# Patient Record
Sex: Female | Born: 1980 | Race: Black or African American | Hispanic: No | Marital: Single | Smoking: Former smoker
Health system: Southern US, Community
[De-identification: ages and names within clinical notes are randomized; demographics above are authoritative.]

---

## 2000-11-02 ENCOUNTER — Other Ambulatory Visit: Admission: RE | Admit: 2000-11-02 | Discharge: 2000-11-02 | Payer: Self-pay | Admitting: Obstetrics

## 2000-11-02 ENCOUNTER — Encounter (INDEPENDENT_AMBULATORY_CARE_PROVIDER_SITE_OTHER): Payer: Self-pay

## 2001-10-12 ENCOUNTER — Encounter: Admission: RE | Admit: 2001-10-12 | Discharge: 2001-10-12 | Payer: Self-pay | Admitting: Family Medicine

## 2001-10-12 ENCOUNTER — Other Ambulatory Visit: Admission: RE | Admit: 2001-10-12 | Discharge: 2001-10-12 | Payer: Self-pay | Admitting: Family Medicine

## 2001-10-12 ENCOUNTER — Encounter (INDEPENDENT_AMBULATORY_CARE_PROVIDER_SITE_OTHER): Payer: Self-pay | Admitting: Specialist

## 2001-10-26 ENCOUNTER — Encounter: Admission: RE | Admit: 2001-10-26 | Discharge: 2001-10-26 | Payer: Self-pay | Admitting: Family Medicine

## 2002-11-29 ENCOUNTER — Encounter: Admission: RE | Admit: 2002-11-29 | Discharge: 2002-11-29 | Payer: Self-pay | Admitting: Family Medicine

## 2002-11-29 ENCOUNTER — Other Ambulatory Visit: Admission: RE | Admit: 2002-11-29 | Discharge: 2002-11-29 | Payer: Self-pay | Admitting: Family Medicine

## 2002-11-29 ENCOUNTER — Encounter (INDEPENDENT_AMBULATORY_CARE_PROVIDER_SITE_OTHER): Payer: Self-pay | Admitting: *Deleted

## 2002-12-06 ENCOUNTER — Encounter: Admission: RE | Admit: 2002-12-06 | Discharge: 2002-12-06 | Payer: Self-pay | Admitting: Sports Medicine

## 2003-03-07 ENCOUNTER — Encounter: Admission: RE | Admit: 2003-03-07 | Discharge: 2003-03-07 | Payer: Self-pay | Admitting: Sports Medicine

## 2003-04-04 ENCOUNTER — Encounter (INDEPENDENT_AMBULATORY_CARE_PROVIDER_SITE_OTHER): Payer: Self-pay | Admitting: *Deleted

## 2003-04-04 ENCOUNTER — Encounter: Admission: RE | Admit: 2003-04-04 | Discharge: 2003-04-04 | Payer: Self-pay | Admitting: Family Medicine

## 2003-04-04 ENCOUNTER — Other Ambulatory Visit: Admission: RE | Admit: 2003-04-04 | Discharge: 2003-04-04 | Payer: Self-pay | Admitting: Family Medicine

## 2003-05-02 ENCOUNTER — Encounter: Admission: RE | Admit: 2003-05-02 | Discharge: 2003-05-02 | Payer: Self-pay | Admitting: Family Medicine

## 2004-04-30 ENCOUNTER — Other Ambulatory Visit: Admission: RE | Admit: 2004-04-30 | Discharge: 2004-04-30 | Payer: Self-pay | Admitting: Family Medicine

## 2004-04-30 ENCOUNTER — Encounter (INDEPENDENT_AMBULATORY_CARE_PROVIDER_SITE_OTHER): Payer: Self-pay | Admitting: Sports Medicine

## 2004-04-30 ENCOUNTER — Encounter: Admission: RE | Admit: 2004-04-30 | Discharge: 2004-04-30 | Payer: Self-pay | Admitting: Family Medicine

## 2004-05-07 ENCOUNTER — Encounter: Admission: RE | Admit: 2004-05-07 | Discharge: 2004-05-07 | Payer: Self-pay | Admitting: Family Medicine

## 2004-08-05 ENCOUNTER — Ambulatory Visit: Payer: Self-pay | Admitting: Sports Medicine

## 2005-01-16 ENCOUNTER — Ambulatory Visit: Payer: Self-pay | Admitting: Sports Medicine

## 2005-06-30 ENCOUNTER — Ambulatory Visit: Payer: Self-pay | Admitting: Sports Medicine

## 2005-07-22 ENCOUNTER — Ambulatory Visit: Payer: Self-pay | Admitting: Family Medicine

## 2006-03-15 ENCOUNTER — Encounter (INDEPENDENT_AMBULATORY_CARE_PROVIDER_SITE_OTHER): Payer: Self-pay | Admitting: *Deleted

## 2006-03-15 LAB — CONVERTED CEMR LAB

## 2006-04-02 ENCOUNTER — Encounter (INDEPENDENT_AMBULATORY_CARE_PROVIDER_SITE_OTHER): Payer: Self-pay | Admitting: Sports Medicine

## 2006-04-02 ENCOUNTER — Ambulatory Visit: Payer: Self-pay | Admitting: Family Medicine

## 2006-04-12 ENCOUNTER — Emergency Department (HOSPITAL_COMMUNITY): Admission: EM | Admit: 2006-04-12 | Discharge: 2006-04-12 | Payer: Self-pay | Admitting: Emergency Medicine

## 2006-11-13 ENCOUNTER — Encounter (INDEPENDENT_AMBULATORY_CARE_PROVIDER_SITE_OTHER): Payer: Self-pay | Admitting: *Deleted

## 2007-02-10 IMAGING — CT CT MAXILLOFACIAL W/O CM
1 of 3 series · 15 of 30 positions shown, 19 images · IV contrast (agent unspecified)
Comparison: none

CLINICAL DATA: Head injury.   The patient fell on his head and face. 
 HEAD CT WITHOUT CONTRAST:
TECHNIQUE: Contiguous axial images were obtained from the base of the skull through the vertex according to standard protocol without contrast.
TECHNIQUE: Axial and coronal CT imaging was performed through the maxillofacial structures.  No intravenous contrast was administered.

[Series 6: orbit 1.0 h30s · axial · 0.30mm/px · z∈[-276,-129]mm · 15 of 165 slices shown, 19 images]
[im 9/165  brain]
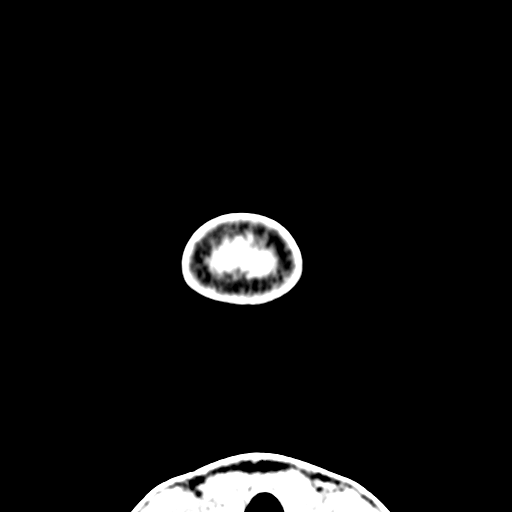
[im 9/165  bone]
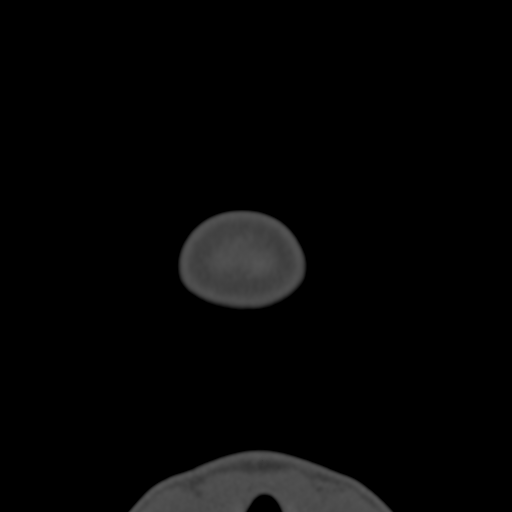
[im 18/165  bone]
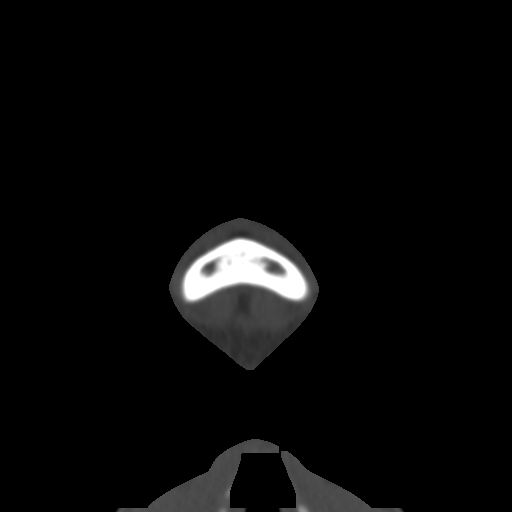
[im 35/165  bone]
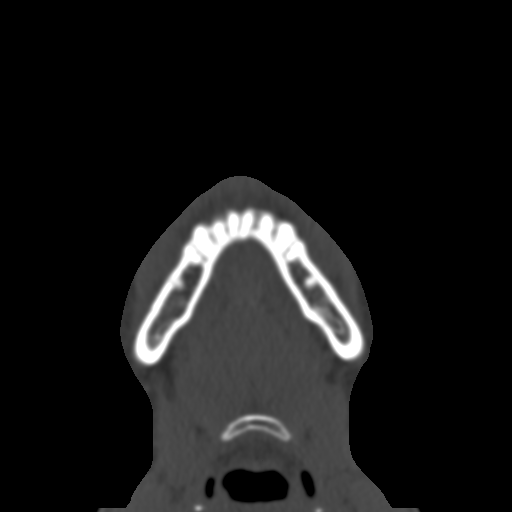
[im 44/165  bone]
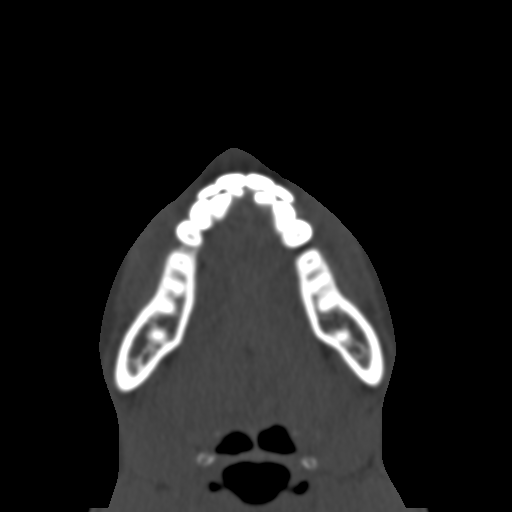
[im 52/165  brain]
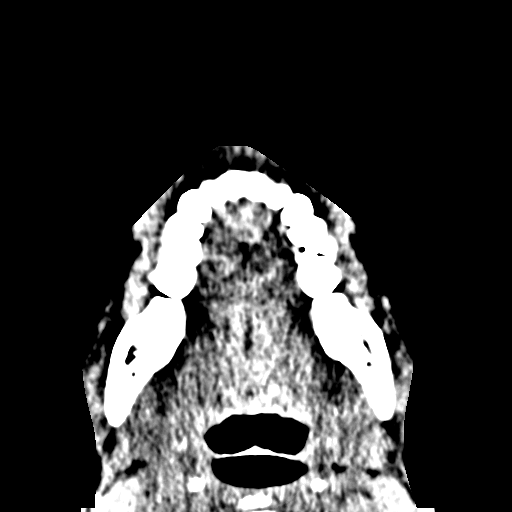
[im 52/165  bone]
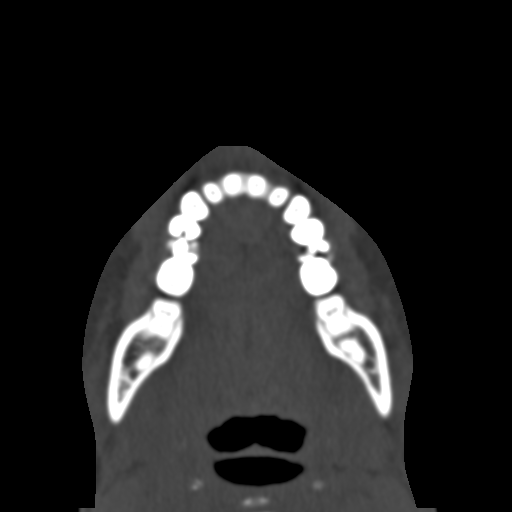
[im 61/165  bone]
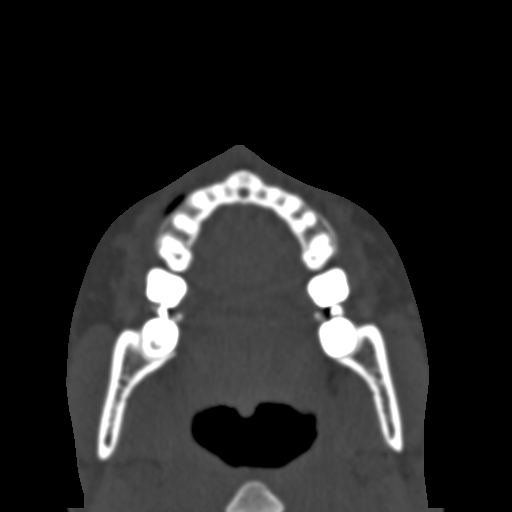
[im 70/165  bone]
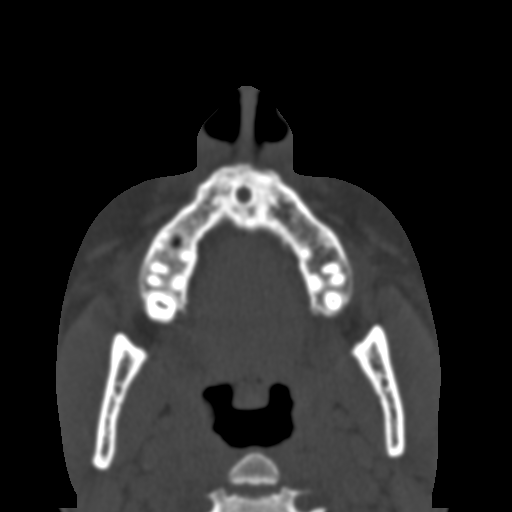
[im 87/165  bone]
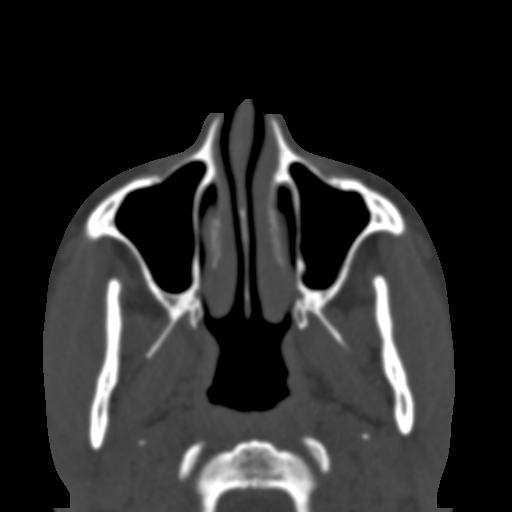
[im 95/165  brain]
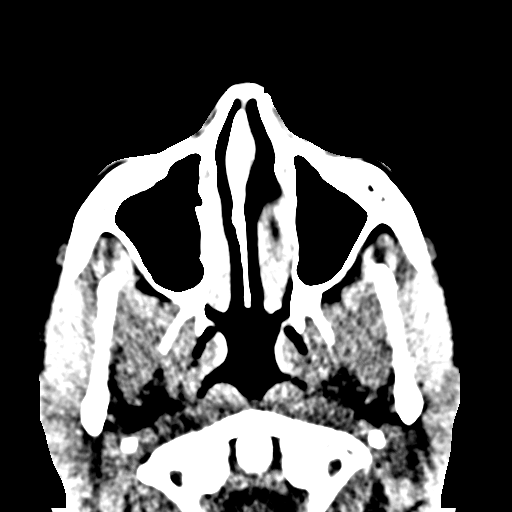
[im 95/165  bone]
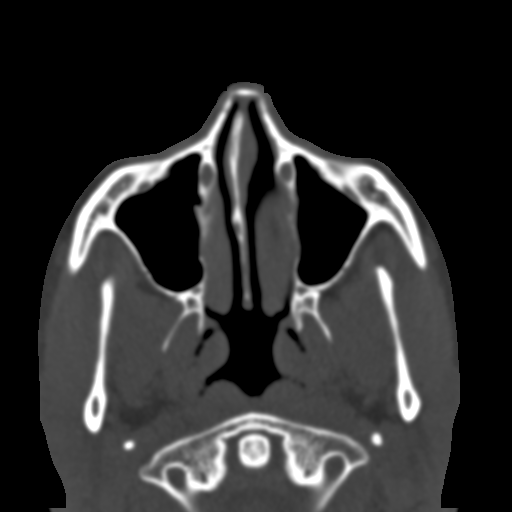
[im 104/165  bone]
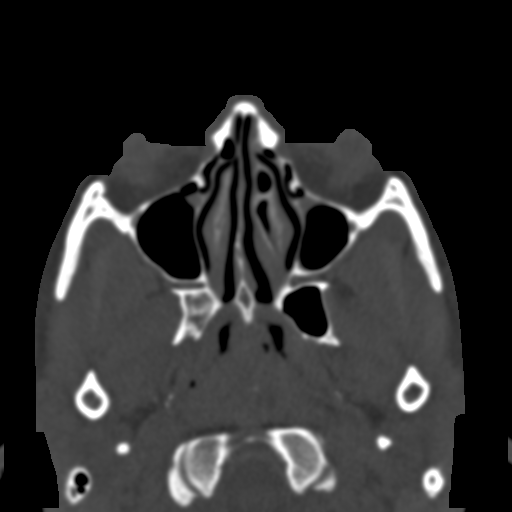
[im 113/165  bone]
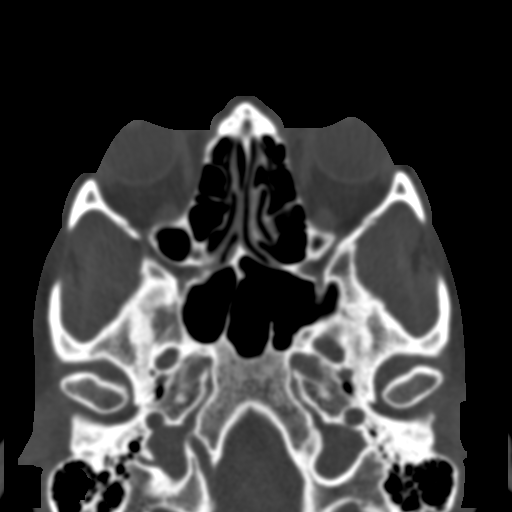
[im 121/165  bone]
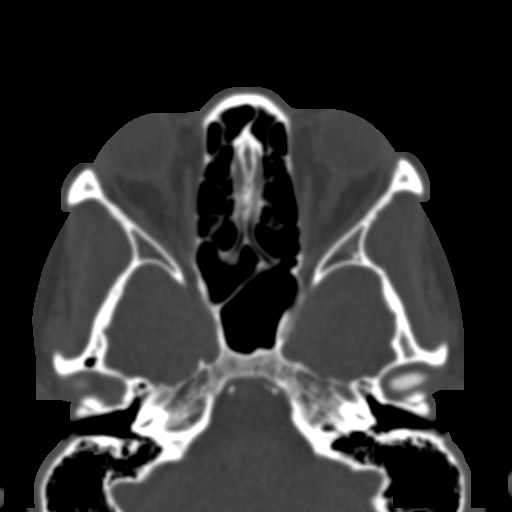
[im 139/165  brain]
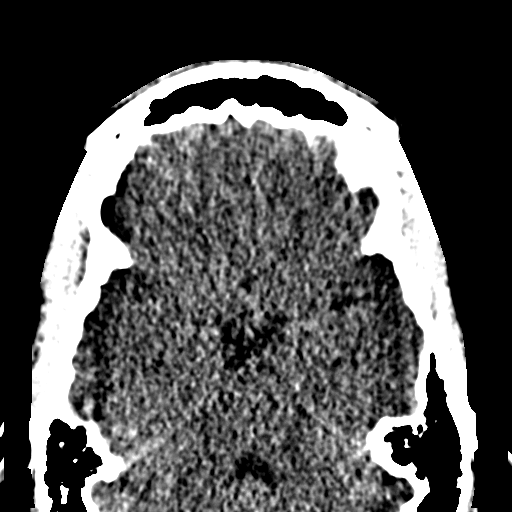
[im 139/165  bone]
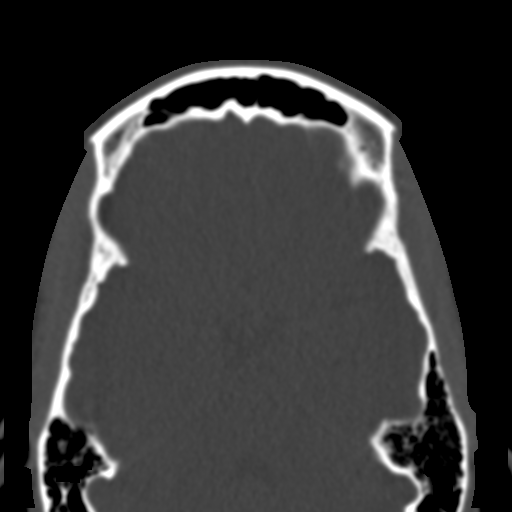
[im 147/165  bone]
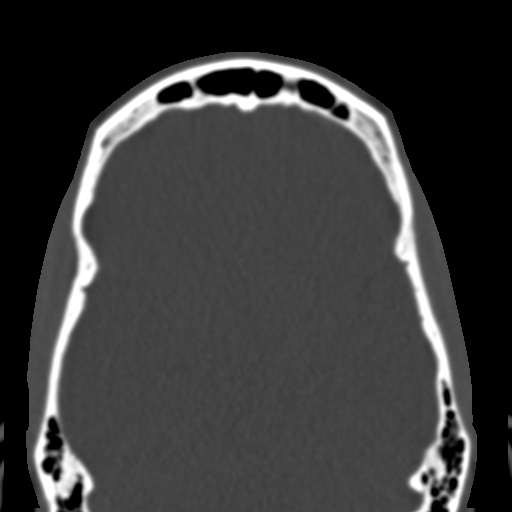
[im 156/165  bone]
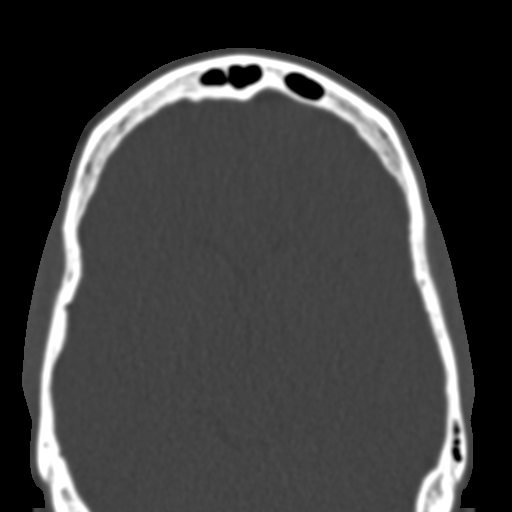

[15 of 30 positions shown; findings below may reference images not displayed]

FINDINGS: There is a tiny area of density along the left side of the tentorium on image #14, which I feel probably represents partial volume effect of the tentorium, but it could represent a tiny hemorrhage at that site. 
 The remainder of the brain is normal.  The ventricles are normal in size.  No bony abnormality.
IMPRESSION: 1.  Essentially normal scan.  Tiny area of density adjacent to the left side of the tentorium is probably partial volume effect, but I cannot exclude a tiny hemorrhage on the tentorium at that site. 
 2.  Otherwise normal exam. 
 MAXILLOFACIAL CT WITHOUT CONTRAST:
FINDINGS: There is no fracture, sinus opacification, or other significant abnormality.
IMPRESSION: Normal facial bones.

## 2007-04-29 ENCOUNTER — Encounter: Payer: Self-pay | Admitting: Family Medicine

## 2007-07-07 ENCOUNTER — Ambulatory Visit: Payer: Self-pay | Admitting: Family Medicine

## 2007-07-07 ENCOUNTER — Encounter: Payer: Self-pay | Admitting: Family Medicine

## 2007-07-07 DIAGNOSIS — Z87898 Personal history of other specified conditions: Secondary | ICD-10-CM

## 2007-07-07 DIAGNOSIS — A6000 Herpesviral infection of urogenital system, unspecified: Secondary | ICD-10-CM | POA: Insufficient documentation

## 2007-07-07 LAB — CONVERTED CEMR LAB
BUN: 13 mg/dL (ref 6–23)
CO2: 23 meq/L (ref 19–32)
Calcium: 9.1 mg/dL (ref 8.4–10.5)
Chloride: 104 meq/L (ref 96–112)

## 2007-07-08 ENCOUNTER — Encounter: Payer: Self-pay | Admitting: Family Medicine

## 2007-07-14 ENCOUNTER — Encounter: Payer: Self-pay | Admitting: Family Medicine

## 2008-10-25 ENCOUNTER — Ambulatory Visit: Payer: Self-pay | Admitting: Family Medicine

## 2008-10-25 ENCOUNTER — Encounter: Payer: Self-pay | Admitting: Family Medicine

## 2008-10-25 LAB — CONVERTED CEMR LAB
Chlamydia, DNA Probe: NEGATIVE
Total CHOL/HDL Ratio: 2.6
Whiff Test: NEGATIVE

## 2008-10-26 ENCOUNTER — Encounter: Payer: Self-pay | Admitting: Family Medicine

## 2008-10-27 ENCOUNTER — Encounter: Payer: Self-pay | Admitting: Family Medicine

## 2010-10-24 ENCOUNTER — Encounter: Payer: Self-pay | Admitting: *Deleted

## 2011-07-30 ENCOUNTER — Other Ambulatory Visit (HOSPITAL_COMMUNITY)
Admission: RE | Admit: 2011-07-30 | Discharge: 2011-07-30 | Disposition: A | Discharge status: Home / Self Care | Payer: Managed Care, Other (non HMO) | Source: Ambulatory Visit | Attending: Family Medicine | Admitting: Family Medicine

## 2011-07-30 ENCOUNTER — Encounter: Payer: Self-pay | Admitting: Family Medicine

## 2011-07-30 ENCOUNTER — Ambulatory Visit (INDEPENDENT_AMBULATORY_CARE_PROVIDER_SITE_OTHER): Payer: Managed Care, Other (non HMO) | Admitting: Family Medicine

## 2011-07-30 VITALS — BP 114/73 | HR 79 | Temp 98.5°F | Ht 65.6 in | Wt 138.0 lb

## 2011-07-30 DIAGNOSIS — Z23 Encounter for immunization: Secondary | ICD-10-CM

## 2011-07-30 DIAGNOSIS — Z124 Encounter for screening for malignant neoplasm of cervix: Secondary | ICD-10-CM

## 2011-07-30 DIAGNOSIS — Z0000 Encounter for general adult medical examination without abnormal findings: Secondary | ICD-10-CM

## 2011-07-30 DIAGNOSIS — N760 Acute vaginitis: Secondary | ICD-10-CM

## 2011-07-30 DIAGNOSIS — Z01419 Encounter for gynecological examination (general) (routine) without abnormal findings: Secondary | ICD-10-CM | POA: Insufficient documentation

## 2011-07-30 LAB — RPR

## 2011-07-30 LAB — POCT WET PREP (WET MOUNT)

## 2011-07-30 MED ORDER — VALACYCLOVIR HCL 500 MG PO TABS: ORAL_TABLET | ORAL | Status: DC

## 2011-07-30 MED ORDER — METRONIDAZOLE 500 MG PO TABS: ORAL_TABLET | ORAL | Status: DC

## 2011-07-30 NOTE — Progress Notes (Signed)
  Subjective:    Patient ID: Kristie Day, female    DOB: 09-03-1981, 30 y.o.   MRN: [id number]  HPI  Here for checkup. Not having any issues. Menses are regular. She is not currently sexually active. Has been on suppression for her herpes and has only had one outbreak in the last year. Has some questions about herpes.  Continues to have issues with vaginal discharge. The metronidazole I had given her in the past seems to help when she uses it intermittently but she's not had any for a while.  Has a new job working for [employer]. He is walking for exercise. Not having any significant health issues at this time.  Review of Systems    review of systems is negative except for vaginal discharge and herpes outbreak as above. In history of present illness. Objective:   Physical Exam  Vital signs reviewed GENERALl: Well developed, well nourished, in no acute distress. NECK: Supple, FROM, without lymphadenopathy.  THYROID: normal without nodularity CAROTID ARTERIES: without bruits LUNGS: clear to auscultation bilaterally. No wheezes or rales. HEART: Regular rate and rhythm, no murmurs ABDOMEN: soft with positive bowel sounds NEURO: No gross focal deficits GU: Externally normal. Cervix appears normal. There is thin white discharge in the vaginal vault. The cervix is retroflexed. No adnexal masses or tenderness. SKIN: Complete skin exam reveals no worrisome lesions. BREASTS: Bilaterally symmetrical without worrisome mass, no skin changes, no nipple changes. PSYCH: Affect is a little flat but she asks and answers questions appropriately. Alert and oriented x4.       Assessment & Plan:  Well adult exam. Pap smear and breast exam today. She requested HIV and RPR testing. #2. History of genital herpes with infrequent outbreaks. We discussed that and I refilled her medication. #3. History of recurrent bacterial vaginosis. Wet prep today shows that again. I refilled her metronidazole and we  discussed how to use that. Return to clinic one year for complete physical C. patient instructions.

## 2011-07-30 NOTE — Patient Instructions (Signed)
I have refilled your Valtrex and also given you some refills of the metronidazole. I will send you a note about your Pap smear and about the wet prep I did today. It was great seeing you! I would plan on seeing you back in one year for physical. If your Pap smear is normal, you  probably won't need one of those for another 2 years but I would still get a check up in a year.  . Please call me if there are issues that arise before next year.

## 2011-07-31 ENCOUNTER — Encounter: Payer: Self-pay | Admitting: Family Medicine

## 2011-08-05 ENCOUNTER — Encounter: Payer: Self-pay | Admitting: Family Medicine

## 2013-11-09 ENCOUNTER — Encounter: Payer: Self-pay | Admitting: Family Medicine

## 2013-11-09 ENCOUNTER — Other Ambulatory Visit (HOSPITAL_COMMUNITY)
Admission: RE | Admit: 2013-11-09 | Discharge: 2013-11-09 | Disposition: A | Discharge status: Home / Self Care | Payer: Self-pay | Source: Ambulatory Visit | Attending: Family Medicine | Admitting: Family Medicine

## 2013-11-09 ENCOUNTER — Ambulatory Visit (INDEPENDENT_AMBULATORY_CARE_PROVIDER_SITE_OTHER): Payer: Self-pay | Admitting: Family Medicine

## 2013-11-09 VITALS — BP 110/78 | HR 63 | Temp 98.2°F | Ht 65.5 in | Wt 143.1 lb

## 2013-11-09 DIAGNOSIS — Z124 Encounter for screening for malignant neoplasm of cervix: Secondary | ICD-10-CM

## 2013-11-09 DIAGNOSIS — Z87898 Personal history of other specified conditions: Secondary | ICD-10-CM

## 2013-11-09 DIAGNOSIS — Z01419 Encounter for gynecological examination (general) (routine) without abnormal findings: Secondary | ICD-10-CM | POA: Insufficient documentation

## 2013-11-09 DIAGNOSIS — N760 Acute vaginitis: Secondary | ICD-10-CM

## 2013-11-09 DIAGNOSIS — Z1151 Encounter for screening for human papillomavirus (HPV): Secondary | ICD-10-CM | POA: Insufficient documentation

## 2013-11-09 DIAGNOSIS — Z0000 Encounter for general adult medical examination without abnormal findings: Secondary | ICD-10-CM

## 2013-11-09 DIAGNOSIS — Z113 Encounter for screening for infections with a predominantly sexual mode of transmission: Secondary | ICD-10-CM | POA: Insufficient documentation

## 2013-11-09 LAB — POCT WET PREP (WET MOUNT): CLUE CELLS WET PREP WHIFF POC: NEGATIVE

## 2013-11-09 MED ORDER — VALACYCLOVIR HCL 500 MG PO TABS
ORAL_TABLET | ORAL | Status: DC
Start: 2013-11-09 — End: 2019-02-22

## 2013-11-09 MED ORDER — FLUCONAZOLE 100 MG PO TABS: ORAL_TABLET | ORAL | Status: DC

## 2013-11-09 NOTE — Patient Instructions (Signed)
I am giving you a rx for a vaginal yeast infection.I have called it in

## 2013-11-10 LAB — CERVICOVAGINAL ANCILLARY ONLY
Chlamydia: NEGATIVE
Neisseria Gonorrhea: NEGATIVE

## 2013-11-15 ENCOUNTER — Encounter: Payer: Self-pay | Admitting: Family Medicine

## 2013-11-17 NOTE — Assessment & Plan Note (Signed)
General exam today as a well as Pap pelvic, wet prep. Discussed exercise. Discussed bone health and calcium intake. She is not yet due for mammogram. I will notify her about her Pap smear.

## 2013-11-17 NOTE — Assessment & Plan Note (Signed)
Continue suppressive therapy. She's doing quite well with this.

## 2013-11-17 NOTE — Progress Notes (Signed)
   Subjective:    Patient ID: Kristie Day, female    DOB: [birth year], 33 y.o.   MRN: [id number]  HPI Here for well woman GYN exam. She's had no problems. She is due for Pap smear. She continues on valacyclovir for her genital herpes suppression that seems to working well. She's had one or 2 months break out but no large outbreaks. She's otherwise felt well. She has had some mild vaginal itching over the last 2 weeks. She thinks is related to using a different shower gel. We'll check that. Small amount of thin white vaginal discharge. Periods are regular   Review of Systems  Constitutional: Negative for activity change, appetite change, fatigue and unexpected weight change.  HENT: Negative for ear pain.   Eyes: Negative for pain and visual disturbance.  Respiratory: Negative for cough, chest tightness and shortness of breath.   Gastrointestinal: Negative for abdominal pain and blood in stool.  Genitourinary: Positive for vaginal discharge. Negative for pelvic pain.  Neurological: Negative for weakness and light-headedness.  Psychiatric/Behavioral: Negative for behavioral problems, confusion, dysphoric mood, decreased concentration and agitation. The patient is not nervous/anxious.        Objective:   Physical Exam  Constitutional: She is oriented to person, place, and time. She appears well-developed and well-nourished.  HENT:  Right Ear: External ear normal.  Left Ear: External ear normal.  Nose: Nose normal.  Eyes: Conjunctivae and EOM are normal. Pupils are equal, round, and reactive to light. Right eye exhibits no discharge. Left eye exhibits discharge. No scleral icterus.  Neck: Normal range of motion. Neck supple. No thyromegaly present.  Cardiovascular: Normal rate, regular rhythm and normal heart sounds.   Pulmonary/Chest: Effort normal and breath sounds normal. She has no wheezes.  Abdominal: Soft. Bowel sounds are normal. She exhibits no distension. There is no tenderness.   Genitourinary: Vagina normal and uterus normal.  Small amount of white discharge. There is no rash. Cervix appears normal. Does know it cervical motion tenderness, no adnexal masses or tenderness.  Musculoskeletal: Normal range of motion.  Lymphadenopathy:    She has no cervical adenopathy.  Neurological: She is alert and oriented to person, place, and time. She has normal reflexes.  Skin: No rash noted.  Psychiatric: She has a normal mood and affect. Her behavior is normal. Judgment and thought content normal.          Assessment & Plan:

## 2014-04-09 ENCOUNTER — Other Ambulatory Visit: Payer: Self-pay | Admitting: Family Medicine

## 2014-08-01 ENCOUNTER — Other Ambulatory Visit: Payer: Self-pay | Admitting: Family Medicine

## 2014-10-24 ENCOUNTER — Other Ambulatory Visit: Payer: Self-pay | Admitting: Family Medicine

## 2015-08-21 ENCOUNTER — Other Ambulatory Visit: Payer: Self-pay | Admitting: Family Medicine

## 2016-09-20 ENCOUNTER — Other Ambulatory Visit: Payer: Self-pay | Admitting: Family Medicine

## 2016-12-01 ENCOUNTER — Other Ambulatory Visit: Payer: Self-pay | Admitting: Family Medicine

## 2018-12-15 ENCOUNTER — Encounter: Payer: Self-pay | Admitting: Family Medicine

## 2019-02-21 ENCOUNTER — Other Ambulatory Visit (HOSPITAL_COMMUNITY)
Admission: RE | Admit: 2019-02-21 | Discharge: 2019-02-21 | Disposition: A | Discharge status: Home / Self Care | Payer: BC Managed Care – PPO | Source: Ambulatory Visit | Attending: Family Medicine | Admitting: Family Medicine

## 2019-02-21 ENCOUNTER — Ambulatory Visit: Payer: BC Managed Care – PPO | Admitting: Family Medicine

## 2019-02-21 ENCOUNTER — Other Ambulatory Visit: Payer: Self-pay

## 2019-02-21 ENCOUNTER — Encounter: Payer: Self-pay | Admitting: Family Medicine

## 2019-02-21 VITALS — BP 122/82 | HR 83 | Wt 165.0 lb

## 2019-02-21 DIAGNOSIS — A6004 Herpesviral vulvovaginitis: Secondary | ICD-10-CM

## 2019-02-21 DIAGNOSIS — Z124 Encounter for screening for malignant neoplasm of cervix: Secondary | ICD-10-CM | POA: Insufficient documentation

## 2019-02-21 DIAGNOSIS — N898 Other specified noninflammatory disorders of vagina: Secondary | ICD-10-CM | POA: Diagnosis present

## 2019-02-21 DIAGNOSIS — Z8249 Family history of ischemic heart disease and other diseases of the circulatory system: Secondary | ICD-10-CM

## 2019-02-21 DIAGNOSIS — Z6826 Body mass index (BMI) 26.0-26.9, adult: Secondary | ICD-10-CM

## 2019-02-21 DIAGNOSIS — Z0000 Encounter for general adult medical examination without abnormal findings: Secondary | ICD-10-CM

## 2019-02-21 DIAGNOSIS — Z23 Encounter for immunization: Secondary | ICD-10-CM

## 2019-02-21 DIAGNOSIS — Z841 Family history of disorders of kidney and ureter: Secondary | ICD-10-CM

## 2019-02-21 LAB — POCT WET PREP (WET MOUNT)
Clue Cells Wet Prep Whiff POC: NEGATIVE
Trichomonas Wet Prep HPF POC: ABSENT

## 2019-02-21 NOTE — Assessment & Plan Note (Addendum)
Patient reports most recent outbreak was in April 2020.  She reports that she has run out of her valacyclovir.  She reports that she was on suppressive therapy after having several outbreaks previously.  She currently takes her medications as needed for outbreaks and has run out.  Will send refill of Valacyclovir to pharmacy. Sent Rx for daily suppression with higher dose for outbreak. Patient reports she will likely only take for outbreaks and not daily unless they become more recurrent. In which case, she will call to be seen or to let us know.

## 2019-02-21 NOTE — Assessment & Plan Note (Addendum)
Obtain CBC, BMP, A1c, lipid panel given family history of hypertension and kidney disease.  Patient is not sure if she has any diabetes in her family.  Previous glucose in 2010 without hyperglycemia.  Patient is not hypertensive on exam today.  Physical exam was done within normal limits. We will follow-up with patient about Pap results and remainder of labs. Tdap provided today. Patient to follow up in one year or sooner as needed.  CBC, BMP, A1C, Lipid panel- returned wnl.   Will f/u vaginal results. Will let patient know via MyChart.

## 2019-02-21 NOTE — Progress Notes (Signed)
Established Patient - Clinic Visit Subjective  Subjective  Patient ID: MRN [id number]   Date of birth: [birth year]    PCP: Kristie RampNeal, Sara L, MD Name: Kristie Day, 38 y.o. female  CC: Annual Exam  # Vaginal discharge  Discharge, odor, especially before and after cycle, there is a very strong odor. No abnormal color. Pt describes odor as strong fishy odor when around her period. But otherwise is unable to specify smell. Currently not sexually active. No pain, no itching, no irritation. No history of yeast infections. Has a remote history of BV. Previous history of Herpes (Dx'ed 2001- valtrex 500mg ). Last outbreak in mid-April 2020.Patient reports this has been going on for a couple of years. Has tried to self medicate with OTC medications and believes she may have irritated it even more. Replense and something that she ordered online (pill) which she believes is what is mainly making it worse.  Patient has no other complaints today. She otherwise denies fevers, chills, n/v, diarrhea, constipation.   ROS: See HPI  HISTORY Meds   Allergies: No medications, no allergies, no contraception Pertinent PMHx: Herpes simplex Family History: family history includes Hypertension in her mother; Kidney disease in her maternal aunt, maternal aunt, maternal uncle, and mother. Social Hx: Kristie Day reports that she has quit smoking. She quit smokeless tobacco use about 9 years ago. No history on file for alcohol and drug. Social History   Social History Narrative   Two glasses of wine weekly. Former smoker. No drug use. Working at [employer]. Son lives at home with her for the summer. No other children. Never married. Good support system in Kristie Day.      Objective   Objective  Physical Exam:  BP 122/82    Pulse 83    Wt 74.8 kg    LMP 02/12/2019 (Exact Date)    SpO2 99%    BMI 27.04 kg/m   Gen: NAD, alert, non-toxic, well-nourished, well-appearing, pleasant HEENT: Normocephaic,  atraumatic. PERRLA, clear conjuctiva, no scleral icterus and injection. Normal EOM.  Hearing intact. TM pearly grey bilaterally with no fluid.  Neck supple with no LAD, nodules, or gross abnormality.  Nares patent with no discharge.  Maxillary and frontal sinuses nontender to palpation.  Oropharynx without erythema and lesions.  Tonsils nonswollen and without exudate.   CV: Regular rate and rhythm.  Normal S1-S2.  No murmur, gallops, S3, S4 appreciated.  Normal capillary refill bilaterally.  Radial pulses 2+ bilaterally. No bilateral lower extremity edema. Resp: Clear to auscultation bilaterally.  No wheezing, rales, rhonchi, or other abnormal lung sounds.  No increased work of breathing appreciated. Abd: Nontender and nondistended on palpation to all 4 quadrants.  Positive bowel sounds. Skin: No obvious rashes, lesions, or trauma.  Normal turgor.  MSK: Normal ROM. Normal strength and tone.  Neuro: Cranial nerves II through VI grossly intact. Gait normal.  Alert and oriented x4.  No obvious abnormal movements. Psych: Cooperative with exam.  Normal speech. Pleasant. Makes good eye contact. GU: External vulva and vagina nonerythematous, without any obvious lesions or rash.  Thin white, creamy discharge appreciated, malodorous. Normal ruggae of vaginal walls.  Cervix is non erythematous and non-friable. Cervix lies posteriorly and to patient's left.  There is no cervical motion tenderness, masses or gross abnormalities appreciated during bimanual exam.    Pertinent Labs & Imaging:  2015: Pap - wnl, GC/CT negative, RPR NR,  2010: LDL mildly elevated at 101   Reviewed in chart as appropriate  Assessment  Assessment & Plan  Encounter for annual physical exam Obtain CBC, BMP, A1c, lipid panel given family history of hypertension and kidney disease.  Patient is not sure if she has any diabetes in her family.  Previous glucose in 2010 without hyperglycemia.  Patient is not hypertensive on exam today.   Physical exam was done within normal limits. We will follow-up with patient about Pap results and remainder of labs. Tdap provided today. Patient to follow up in one year or sooner as needed.  CBC, BMP, A1C, Lipid panel- returned wnl.   Will f/u vaginal results. Will let patient know via MyChart.   Genital HSV Patient reports most recent outbreak was in April 2020.  She reports that she has run out of her valacyclovir.  She reports that she was on suppressive therapy after having several outbreaks previously.  She currently takes her medications as needed for outbreaks and has run out.  Will send refill of Valacyclovir to pharmacy. Sent Rx for daily suppression with higher dose for outbreak. Patient reports she will likely only take for outbreaks and not daily unless they become more recurrent. In which case, she will call to be seen or to let us know.   Vaginal discharge Wet prep is unremarkable except for a few bacteria. Bacteria could be benign/transient vaginitis.  No yeast, no clue cells, negative whiff. Patient denies dysuria, itching, pain, erythema, evidence of herpes outbreak.  She has been having this odor and discharge for a couple of years. Given patient has no other symptoms and it has been happening for at least 2 years, this is likely not an acute problem and more likely to be physiologic. Will not provide any medication today. Reassurance provided. Should patient develop symptoms, she will return to office. Pt is agreeable with plan.   Body mass index 26.0-26.9, adult Pt does not exercise daily. Discussed importance of good diet and daily physical activity.   Handout provided    Kristie Day, M.D. [place]   PGY -1 02/22/2019, 10:39 PM

## 2019-02-21 NOTE — Assessment & Plan Note (Addendum)
Wet prep is unremarkable except for a few bacteria. Bacteria could be benign/transient vaginitis.  No yeast, no clue cells, negative whiff. Patient denies dysuria, itching, pain, erythema, evidence of herpes outbreak.  She has been having this odor and discharge for a couple of years. Given patient has no other symptoms and it has been happening for at least 2 years, this is likely not an acute problem and more likely to be physiologic. Will not provide any medication today. Reassurance provided. Should patient develop symptoms, she will return to office. Pt is agreeable with plan.

## 2019-02-21 NOTE — Patient Instructions (Addendum)
Dear Kristie Day,   It was very nice to see you! Thank you for taking your time to come in to be seen. Today, we discussed the following:   Annual Physical   Your physical exam is unremarkable.   Sent refills for valtrex to [facility]  I will contact you for any abnormal results for your send out labs.   You likely have some soreness on your arm from the vaccination today.  This will go away in 1-2 days  The wet prep collected today did not have any findings.  There is no indication of any infection at this time.  We will wait for the remainder of your vaginal labs to return, though they will likely not show [place] any cause of your discharge.  It is also possible that this discharge is your normal body function, as discharge can vary over time as we age.  It is reassuring that you are not having any urinary symptoms, pain, or itching.  Please return if you have any additional symptoms or further concern.  Please follow up in one year or sooner for concerning or worsening symptoms.    Be well,   Dr. Zettie Cooley [facility] [phone]   Preventive Care 18-39 Years, Female Preventive care refers to lifestyle choices and visits with your health care provider that can promote health and wellness. What does preventive care include?   A yearly physical exam. This is also called an annual well check.  Dental exams once or twice a year.  Routine eye exams. Ask your health care provider how often you should have your eyes checked.  Personal lifestyle choices, including: ? Daily care of your teeth and gums. ? Regular physical activity. ? Eating a healthy diet. ? Avoiding tobacco and drug use. ? Limiting alcohol use. ? Practicing safe sex. ? Taking vitamin and mineral supplements as recommended by your health care provider. What happens during an annual well check? The services and screenings done by your health care provider during your annual well check will  depend on your age, overall health, lifestyle risk factors, and family history of disease. Counseling Your health care provider may ask you questions about your:  Alcohol use.  Tobacco use.  Drug use.  Emotional well-being.  Home and relationship well-being.  Sexual activity.  Eating habits.  Work and work Statistician.  Method of birth control.  Menstrual cycle.  Pregnancy history. Screening You may have the following tests or measurements:  Height, weight, and BMI.  Diabetes screening. This is done by checking your blood sugar (glucose) after you have not eaten for a while (fasting).  Blood pressure.  Lipid and cholesterol levels. These may be checked every 5 years starting at age 70.  Skin check.  Hepatitis C blood test.  Hepatitis B blood test.  Sexually transmitted disease (STD) testing.  BRCA-related cancer screening. This may be done if you have a family history of breast, ovarian, tubal, or peritoneal cancers.  Pelvic exam and Pap test. This may be done every 3 years starting at age 73. Starting at age 60, this may be done every 5 years if you have a Pap test in combination with an HPV test. Discuss your test results, treatment options, and if necessary, the need for more tests with your health care provider. Vaccines Your health care provider may recommend certain vaccines, such as:  Influenza vaccine. This is recommended every year.  Tetanus, diphtheria, and acellular pertussis (Tdap, Td) vaccine. You may need  a Td booster every 10 years.  Varicella vaccine. You may need this if you have not been vaccinated.  HPV vaccine. If you are 13 or younger, you may need three doses over 6 months.  Measles, mumps, and rubella (MMR) vaccine. You may need at least one dose of MMR. You may also need a second dose.  Pneumococcal 13-valent conjugate (PCV13) vaccine. You may need this if you have certain conditions and were not previously  vaccinated.  Pneumococcal polysaccharide (PPSV23) vaccine. You may need one or two doses if you smoke cigarettes or if you have certain conditions.  Meningococcal vaccine. One dose is recommended if you are age 33-21 years and a first-year college student living in a residence hall, or if you have one of several medical conditions. You may also need additional booster doses.  Hepatitis A vaccine. You may need this if you have certain conditions or if you travel or work in places where you may be exposed to hepatitis A.  Hepatitis B vaccine. You may need this if you have certain conditions or if you travel or work in places where you may be exposed to hepatitis B.  Haemophilus influenzae type b (Hib) vaccine. You may need this if you have certain risk factors. Talk to your health care provider about which screenings and vaccines you need and how often you need them. This information is not intended to replace advice given to you by your health care provider. Make sure you discuss any questions you have with your health care provider. Document Released: 10/28/2001 Document Revised: 04/14/2017 Document Reviewed: 07/03/2015 Elsevier Interactive Patient Education  2019 [employer].  Vaginitis Vaginitis is a condition in which the vaginal tissue swells and becomes red (inflamed). This condition is most often caused by a change in the normal balance of bacteria and yeast that live in the vagina. This change causes an overgrowth of certain bacteria or yeast, which causes the inflammation. There are different types of vaginitis, but the most common types are:  Bacterial vaginosis.  Yeast infection (candidiasis).  Trichomoniasis vaginitis. This is a sexually transmitted disease (STD).  Viral vaginitis.  Atrophic vaginitis.  Allergic vaginitis. What are the causes? The cause of this condition depends on the type of vaginitis. It can be caused by:  Bacteria (bacterial vaginosis).  Yeast,  which is a fungus (yeast infection).  A parasite (trichomoniasis vaginitis).  A virus (viral vaginitis).  Low hormone levels (atrophic vaginitis). Low hormone levels can occur during pregnancy, breastfeeding, or after menopause.  Irritants, such as bubble baths, scented tampons, and feminine sprays (allergic vaginitis). Other factors can change the normal balance of the yeast and bacteria that live in the vagina. These include:  Antibiotic medicines.  Poor hygiene.  Diaphragms, vaginal sponges, spermicides, birth control pills, and intrauterine devices (IUD).  Sex.  Infection.  Uncontrolled diabetes.  A weakened defense (immune) system. What increases the risk? This condition is more likely to develop in women who:  Smoke.  Use vaginal douches, scented tampons, or scented sanitary pads.  Wear tight-fitting pants.  Wear thong underwear.  Use oral birth control pills or an IUD.  Have sex without a condom.  Have multiple sex partners.  Have an STD.  Frequently use the spermicide [id number]-9.  Eat lots of foods high in sugar.  Have uncontrolled diabetes.  Have low estrogen levels.  Have a weakened immune system from an immune disorder or medical treatment.  Are pregnant or breastfeeding. What are the signs or symptoms? Symptoms  vary depending on the cause of the vaginitis. Common symptoms include:  Abnormal vaginal discharge. ? The discharge is white, gray, or yellow with bacterial vaginosis. ? The discharge is thick, white, and cheesy with a yeast infection. ? The discharge is frothy and yellow or greenish with trichomoniasis.  A bad vaginal smell. The smell is fishy with bacterial vaginosis.  Vaginal itching, pain, or swelling.  Sex that is painful.  Pain or burning when urinating. Sometimes there are no symptoms. How is this diagnosed? This condition is diagnosed based on your symptoms and medical history. A physical exam, including a pelvic exam,  will also be done. You may also have other tests, including:  Tests to determine the pH level (acidity or alkalinity) of your vagina.  A whiff test, to assess the odor that results when a sample of your vaginal discharge is mixed with a potassium hydroxide solution.  Tests of vaginal fluid. A sample will be examined under a microscope. How is this treated? Treatment varies depending on the type of vaginitis you have. Your treatment may include:  Antibiotic creams or pills to treat bacterial vaginosis and trichomoniasis.  Antifungal medicines, such as vaginal creams or suppositories, to treat a yeast infection.  Medicine to ease discomfort if you have viral vaginitis. Your sexual partner should also be treated.  Estrogen delivered in a cream, pill, suppository, or vaginal ring to treat atrophic vaginitis. If vaginal dryness occurs, lubricants and moisturizing creams may help. You may need to avoid scented soaps, sprays, or douches.  Stopping use of a product that is causing allergic vaginitis. Then using a vaginal cream to treat the symptoms. Follow these instructions at home: Lifestyle  Keep your genital area clean and dry. Avoid soap, and only rinse the area with water.  Do not douche or use tampons until your health care provider says it is okay to do so. Use sanitary pads, if needed.  Do not have sex until your health care provider approves. When you can return to sex, practice safe sex and use condoms.  Wipe from front to back. This avoids the spread of bacteria from the rectum to the vagina. General instructions  Take over-the-counter and prescription medicines only as told by your health care provider.  If you were prescribed an antibiotic medicine, take or use it as told by your health care provider. Do not stop taking or using the antibiotic even if you start to feel better.  Keep all follow-up visits as told by your health care provider. This is important. How is this  prevented?  Use mild, non-scented products. Do not use things that can irritate the vagina, such as fabric softeners. Avoid the following products if they are scented: ? Feminine sprays. ? Detergents. ? Tampons. ? Feminine hygiene products. ? Soaps or bubble baths.  Let air reach your genital area. ? Wear cotton underwear to reduce moisture buildup. ? Avoid wearing underwear while you sleep. ? Avoid wearing tight pants and underwear or nylons without a cotton panel. ? Avoid wearing thong underwear.  Take off any wet clothing, such as bathing suits, as soon as possible.  Practice safe sex and use condoms. Contact a health care provider if:  You have abdominal pain.  You have a fever.  You have symptoms that last for more than 2-3 days. Get help right away if:  You have a fever and your symptoms suddenly get worse. Summary  Vaginitis is a condition in which the vaginal tissue becomes inflamed.This condition  is most often caused by a change in the normal balance of bacteria and yeast that live in the vagina.  Treatment varies depending on the type of vaginitis you have.  Do not douche, use tampons , or have sex until your health care provider approves. When you can return to sex, practice safe sex and use condoms. This information is not intended to replace advice given to you by your health care provider. Make sure you discuss any questions you have with your health care provider. Document Released: 06/29/2007 Document Revised: 10/07/2016 Document Reviewed: 10/07/2016 Elsevier Interactive Patient Education  2019 [employer].

## 2019-02-22 DIAGNOSIS — Z6826 Body mass index (BMI) 26.0-26.9, adult: Secondary | ICD-10-CM | POA: Insufficient documentation

## 2019-02-22 LAB — BASIC METABOLIC PANEL
BUN/Creatinine Ratio: 16 (ref 9–23)
BUN: 13 mg/dL (ref 6–20)
CO2: 24 mmol/L (ref 20–29)
Calcium: 9.9 mg/dL (ref 8.7–10.2)
Chloride: 96 mmol/L (ref 96–106)
Creatinine, Ser: 0.79 mg/dL (ref 0.57–1.00)
GFR calc Af Amer: 109 mL/min/{1.73_m2} (ref >59)
GFR calc non Af Amer: 95 mL/min/{1.73_m2} (ref >59)
Glucose: 87 mg/dL (ref 65–99)
Potassium: 4.6 mmol/L (ref 3.5–5.2)
Sodium: 135 mmol/L (ref 134–144)

## 2019-02-22 LAB — POCT GLYCOSYLATED HEMOGLOBIN (HGB A1C): Hemoglobin A1C: 5.3 % (ref 4.0–5.6)

## 2019-02-22 LAB — LIPID PANEL
Chol/HDL Ratio: 2.3 ratio (ref 0.0–4.4)
Cholesterol, Total: 190 mg/dL (ref 100–199)
HDL: 83 mg/dL (ref >39)
LDL Calculated: 96 mg/dL (ref 0–99)
Triglycerides: 55 mg/dL (ref 0–149)
VLDL Cholesterol Cal: 11 mg/dL (ref 5–40)

## 2019-02-22 LAB — CBC
Hematocrit: 43.2 % (ref 34.0–46.6)
Hemoglobin: 14.8 g/dL (ref 11.1–15.9)
MCH: 30.1 pg (ref 26.6–33.0)
MCHC: 34.3 g/dL (ref 31.5–35.7)
MCV: 88 fL (ref 79–97)
Platelets: 312 10*3/uL (ref 150–450)
RBC: 4.92 x10E6/uL (ref 3.77–5.28)
RDW: 12.2 % (ref 11.7–15.4)
WBC: 5.7 10*3/uL (ref 3.4–10.8)

## 2019-02-22 MED ORDER — VALACYCLOVIR HCL 500 MG PO TABS: ORAL_TABLET | ORAL | 12 refills | Status: DC

## 2019-02-22 NOTE — Assessment & Plan Note (Signed)
Pt does not exercise daily. Discussed importance of good diet and daily physical activity.   Handout provided

## 2019-02-24 LAB — CYTOLOGY - PAP
Adequacy: ABSENT
Chlamydia: NEGATIVE
Diagnosis: NEGATIVE
HPV: NOT DETECTED
Neisseria Gonorrhea: NEGATIVE

## 2020-03-01 ENCOUNTER — Other Ambulatory Visit: Payer: Self-pay

## 2020-03-01 MED ORDER — VALACYCLOVIR HCL 500 MG PO TABS: ORAL_TABLET | ORAL | 12 refills | Status: DC

## 2021-09-30 ENCOUNTER — Other Ambulatory Visit: Payer: Self-pay | Admitting: *Deleted

## 2021-09-30 MED ORDER — VALACYCLOVIR HCL 500 MG PO TABS: ORAL_TABLET | ORAL | 12 refills | Status: AC

## 2022-02-18 ENCOUNTER — Encounter: Payer: Self-pay | Admitting: *Deleted

## 2023-07-02 ENCOUNTER — Encounter (HOSPITAL_BASED_OUTPATIENT_CLINIC_OR_DEPARTMENT_OTHER): Payer: Commercial Managed Care - PPO | Admitting: Radiology

## 2023-07-02 DIAGNOSIS — Z1231 Encounter for screening mammogram for malignant neoplasm of breast: Secondary | ICD-10-CM

## 2023-07-24 ENCOUNTER — Encounter: Payer: Self-pay | Admitting: Family Medicine
